# Patient Record
Sex: Male | Born: 1985 | Race: White | Hispanic: No | Marital: Single | Smoking: Never smoker
Health system: Southern US, Community
[De-identification: ages and names within clinical notes are randomized; demographics above are authoritative.]

---

## 2005-01-27 ENCOUNTER — Emergency Department (HOSPITAL_COMMUNITY): Admission: EM | Admit: 2005-01-27 | Discharge: 2005-01-27 | Payer: Self-pay | Admitting: Emergency Medicine

## 2005-01-28 ENCOUNTER — Ambulatory Visit (HOSPITAL_COMMUNITY): Admission: RE | Admit: 2005-01-28 | Discharge: 2005-01-28 | Payer: Self-pay | Admitting: Orthopedic Surgery

## 2016-12-14 ENCOUNTER — Encounter (HOSPITAL_COMMUNITY): Payer: Self-pay | Admitting: Emergency Medicine

## 2016-12-14 ENCOUNTER — Emergency Department (HOSPITAL_COMMUNITY)
Admission: EM | Admit: 2016-12-14 | Discharge: 2016-12-15 | Disposition: A | Discharge status: Home / Self Care | Payer: No Typology Code available for payment source | Attending: Emergency Medicine | Admitting: Emergency Medicine

## 2016-12-14 ENCOUNTER — Emergency Department (HOSPITAL_COMMUNITY): Payer: No Typology Code available for payment source

## 2016-12-14 DIAGNOSIS — Y999 Unspecified external cause status: Secondary | ICD-10-CM | POA: Diagnosis not present

## 2016-12-14 DIAGNOSIS — Y92410 Unspecified street and highway as the place of occurrence of the external cause: Secondary | ICD-10-CM | POA: Diagnosis not present

## 2016-12-14 DIAGNOSIS — Y939 Activity, unspecified: Secondary | ICD-10-CM | POA: Insufficient documentation

## 2016-12-14 DIAGNOSIS — R51 Headache: Secondary | ICD-10-CM | POA: Insufficient documentation

## 2016-12-14 DIAGNOSIS — M542 Cervicalgia: Secondary | ICD-10-CM | POA: Insufficient documentation
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
## 2016-12-14 NOTE — Discharge Instructions (Signed)
Head injury  You have had a head injury which does not appear to require admission at this time. A concussion is a status changed mental ability because of trauma.  Seek immediate medical attention if:  There is confusion or drowsiness You cannot awaken the injured portion (Although children frequently become drowsy after injury) There is nausea or continued, forceful vomiting You notice dizziness or unsteadiness which is getting worse, or inability to walk You have convulsions or unconsciousness You experience a severe, persistent headaches not relieved by Tylenol. (Do not take aspirin as this in pairs clotting abilities). Take other pain medications only as directed You cannot use arms or legs normally There are changes in pupil size of the eye There is clear or bloody discharge from the nose or ears Change in speech, vision, swallowing or understanding. Localized weakness, numbness, tingling or change in bowel or bladder control   Please followup with your doctor in the next 2 days if still having symptoms. If you do not have a family doctor, see the list of followup contact information below.  RESOURCE GUIDE  Insufficient Money for Medicine Contact [employer]:  call "211" or [facility] [phone].  No Primary Care Doctor Call [facility]  [phone] Other agencies that provide inexpensive medical care    [facility]  [phone]    [facility]  [phone]    [facility]  [phone]    [facility]  [phone]    [facility]  [phone]    [facility]  [phone]
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
## 2023-10-17 ENCOUNTER — Other Ambulatory Visit: Payer: Self-pay

## 2023-10-17 ENCOUNTER — Encounter (HOSPITAL_COMMUNITY): Payer: Self-pay

## 2023-10-17 ENCOUNTER — Emergency Department (HOSPITAL_COMMUNITY)
Admission: EM | Admit: 2023-10-17 | Discharge: 2023-10-17 | Disposition: A | Discharge status: Home / Self Care | Payer: Self-pay | Attending: Emergency Medicine | Admitting: Emergency Medicine

## 2023-10-17 DIAGNOSIS — F1910 Other psychoactive substance abuse, uncomplicated: Secondary | ICD-10-CM | POA: Insufficient documentation

## 2023-10-17 DIAGNOSIS — R4182 Altered mental status, unspecified: Secondary | ICD-10-CM | POA: Insufficient documentation

## 2023-10-17 MED ORDER — MIDAZOLAM HCL 2 MG/2ML IJ SOLN: 2.5000 mg | Freq: Once | INTRAMUSCULAR | Status: DC | PRN

## 2023-10-17 MED ORDER — MIDAZOLAM HCL 2 MG/2ML IJ SOLN: 2.5000 mg | Freq: Once | INTRAMUSCULAR | Status: DC
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
## 2023-10-17 NOTE — ED Notes (Signed)
 Patient resting at this time no acute distress noted. Sp02 96% on RA. Safety and comfort maintained siderails up x2 callbell within reach close monitoring in progress. All needs met at this time.

## 2023-10-17 NOTE — Discharge Instructions (Signed)
 Follow up with resources provided. Return to the ER for any concerning symptoms.
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
## 2023-10-17 NOTE — ED Notes (Signed)
Restraints removed at this time.

## 2023-10-17 NOTE — ED Notes (Signed)
 Patient walked to bathroom without incident. Patient resting in stretcher no acute distress patient is come and cooperative at this time. Safety and comfort maintained siderails up x2 patient monitored closely at this time

## 2023-10-17 NOTE — ED Notes (Signed)
 Spoke with girlfriend. On the way with clothes and assist patient home.
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
## 2023-10-17 NOTE — ED Notes (Signed)
 Patient dc'ed home at this time iv dc'ed reviewed discharge instructions with patient and girlfriend. Patient verbalized understanding. Patient is calm at this time a/o able to follow commands. No acute distress steady gait. Encouraged to return to er if s/s persist or become bothersome. Girlfriend at bedside with clothes and assist patient home
